# Patient Record
Sex: Male | Born: 2000 | Race: Black or African American | Hispanic: No | Marital: Single | Smoking: Never smoker
Health system: Southern US, Community
[De-identification: ages and names within clinical notes are randomized; demographics above are authoritative.]

## PROBLEM LIST (undated history)

## (undated) DIAGNOSIS — T7840XA Allergy, unspecified, initial encounter: Secondary | ICD-10-CM

## (undated) HISTORY — PX: CYSTECTOMY: SUR359

---
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
## 2022-03-06 ENCOUNTER — Encounter (HOSPITAL_BASED_OUTPATIENT_CLINIC_OR_DEPARTMENT_OTHER): Payer: Self-pay | Admitting: Emergency Medicine

## 2022-03-06 ENCOUNTER — Emergency Department (HOSPITAL_BASED_OUTPATIENT_CLINIC_OR_DEPARTMENT_OTHER): Payer: Medicaid Other

## 2022-03-06 ENCOUNTER — Emergency Department (HOSPITAL_BASED_OUTPATIENT_CLINIC_OR_DEPARTMENT_OTHER)
Admission: EM | Admit: 2022-03-06 | Discharge: 2022-03-06 | Disposition: A | Discharge status: Home / Self Care | Payer: Medicaid Other | Attending: Emergency Medicine | Admitting: Emergency Medicine

## 2022-03-06 ENCOUNTER — Other Ambulatory Visit: Payer: Self-pay

## 2022-03-06 DIAGNOSIS — Y990 Civilian activity done for income or pay: Secondary | ICD-10-CM | POA: Insufficient documentation

## 2022-03-06 DIAGNOSIS — X501XXA Overexertion from prolonged static or awkward postures, initial encounter: Secondary | ICD-10-CM | POA: Diagnosis not present

## 2022-03-06 DIAGNOSIS — M25572 Pain in left ankle and joints of left foot: Secondary | ICD-10-CM | POA: Diagnosis not present

## 2022-03-06 MED ORDER — IBUPROFEN 200 MG PO TABS: 600.0000 mg | ORAL_TABLET | Freq: Three times a day (TID) | ORAL | 0 refills | Status: DC | PRN

## 2022-03-06 NOTE — ED Notes (Signed)
Pt stable at time of discharge. RR even and unlabored. No acute distress. Discharge instructions reviewed with pt and he voiced understanding. Pt has no questions at this time. Pt ambulatory to lobby.

## 2022-03-06 NOTE — ED Triage Notes (Signed)
Felt a pop in his left ankle and is having pain up leg to left knee pt still able to ambulate  but hurts to walk

## 2022-03-06 NOTE — ED Provider Notes (Signed)
[facility] EMERGENCY DEPARTMENT Provider Note   CSN: [id number] Arrival date & time: 03/06/22  8841     History  Chief Complaint  Patient presents with   Ankle Pain    Trevor Welch is a 21 y.o. male who presents with 2 day h/o L foot/ankle pain. Pt states he was lifting something heavy when he felt a pop in the medial aspect of his right foot/ankle. Since then, pain is constant, worse when walking. He does heavy lifting for his job, which makes his pain worse. He denies numbness or tingling. He has ton taken anything including tylenol, ibuprofen, or used iced.   HPI     Home Medications Prior to Admission medications   Medication Sig Start Date End Date Taking? Authorizing Provider  ibuprofen (ADVIL) 200 MG tablet Take 3 tablets (600 mg total) by mouth every 8 (eight) hours as needed. For pain 03/06/22   Evynn Boutelle, PA-C      Allergies    Amoxicillin    Review of Systems   Review of Systems  Musculoskeletal:  Positive for arthralgias and joint swelling.  Skin:  Negative for color change and wound.  Neurological:  Negative for numbness.    Physical Exam Updated Vital Signs BP 107/68 (BP Location: Right Arm)   Pulse 66   Temp 98.2 F (36.8 C) (Oral)   Resp 16   Ht 5\' 9"  (1.753 m)   Wt 81.6 kg   SpO2 99%   BMI 26.58 kg/m  Physical Exam Vitals and nursing note reviewed.  Constitutional:      Appearance: Normal appearance.  HENT:     Head: Normocephalic and atraumatic.     Mouth/Throat:     Mouth: Mucous membranes are moist.  Eyes:     Pupils: Pupils are equal, round, and reactive to light.  Cardiovascular:     Rate and Rhythm: Normal rate.  Pulmonary:     Effort: Pulmonary effort is normal.  Abdominal:     General: There is no distension.  Musculoskeletal:        General: Swelling and tenderness present.     Cervical back: Normal range of motion.     Comments: Mild swelling of the medial malleolus, talus and navicular bones. Ttp of  this area.  No ttp elsewhere in the foot. No ttp over the lateral malleolus. Full active ROM of the ankle with pain, most prominent with dorsiflexion.   Skin:    General: Skin is warm.     Capillary Refill: Capillary refill takes less than 2 seconds.  Neurological:     Mental Status: He is alert and oriented to person, place, and time.  Psychiatric:        Behavior: Behavior normal.     ED Results / Procedures / Treatments   Labs (all labs ordered are listed, but only abnormal results are displayed) Labs Reviewed - No data to display  EKG None  Radiology DG Ankle Complete Left  Result Date: 03/06/2022 CLINICAL DATA:  Left foot and ankle pain.  Difficulty walking. EXAM: LEFT ANKLE COMPLETE - 3+ VIEW COMPARISON:  Left foot radiographs-03/06/2022 FINDINGS: No fracture or dislocation. Joint spaces are preserved. No joint effusion. Regional soft tissues appear normal. No radiopaque foreign body. IMPRESSION: No explanation for patient's left foot and ankle pain. Electronically Signed   By: 14/01/2022 M.D.   On: 03/06/2022 09:59   DG Foot Complete Left  Result Date: 03/06/2022 CLINICAL DATA:  Patient felt a pop in  the left ankle, now with difficulty walking. EXAM: LEFT FOOT - COMPLETE 3+ VIEW COMPARISON:  Left ankle radiographs-earlier same day FINDINGS: No fracture or dislocation. Joint spaces are preserved. No erosions. No significant hallux valgus deformity. No plantar calcaneal spur. Regional soft tissues appear normal. No radiopaque foreign body. IMPRESSION: No explanation for patient's left foot pain. Electronically Signed   By: Simonne Come M.D.   On: 03/06/2022 09:58    Procedures Procedures    Medications Ordered in ED Medications - No data to display  ED Course/ Medical Decision Making/ A&P                           Medical Decision Making Amount and/or Complexity of Data Reviewed Radiology: ordered.  Risk OTC drugs.    This patient presents to the ED for concern  of L foot/ankle pain. This involves a number of treatment options, and is a complaint that carries with it a low risk of complications and morbidity.  The differential diagnosis includes fracture, MSK injury.   Imaging Studies:  I ordered imaging studies including foot and ankle xrays.  I independently visualized and interpreted imaging which showed no acute fracture or dislocation. I agree with the radiologist interpretation  Disposition:  After consideration of the diagnostic results and the patients response to treatment, I feel that the patent would benefit from outpatient management with ankle brace, rest, ice, and nsaids. Discussed f/u with PCP if pain is not improving. At this time, pt appears safe for d/c. Return precautions given. Pt states he understands and agrees to plan.          Final Clinical Impression(s) / ED Diagnoses Final diagnoses:  Acute left ankle pain    Rx / DC Orders ED Discharge Orders          Ordered    ibuprofen (ADVIL) 200 MG tablet  Every 8 hours PRN        03/06/22 1011              Kayveon Lennartz, PA-C 03/06/22 1016    Long, Arlyss Repress, MD 03/10/22 1601

## 2022-03-06 NOTE — ED Notes (Signed)
Lace-up ankle braced applied by ED tech. Pt tolerated well.

## 2022-03-06 NOTE — Discharge Instructions (Addendum)
Take ibuprofen 3 times a day with meals.  Do not take other anti-inflammatories at the same time (Advil, Motrin, naproxen, Aleve). You may supplement with Tylenol if you need further pain control. Use ice packs for 20 minutes at a time 3 times a day Use the brace to help with pain control Follow up with your primary care doctor in 1 week if pain and swelling isn't improving Return to the ER if you develop numbness, worsening pain, or any new, worsening, or concerning symptoms.

## 2022-12-26 ENCOUNTER — Encounter (HOSPITAL_COMMUNITY): Payer: Self-pay | Admitting: Psychiatry

## 2022-12-26 ENCOUNTER — Ambulatory Visit (HOSPITAL_COMMUNITY)
Admission: EM | Admit: 2022-12-26 | Discharge: 2022-12-28 | Disposition: A | Discharge status: Other Acute Inpatient Hospital | Payer: 59 | Attending: Psychiatry | Admitting: Psychiatry

## 2022-12-26 DIAGNOSIS — Z9152 Personal history of nonsuicidal self-harm: Secondary | ICD-10-CM | POA: Diagnosis not present

## 2022-12-26 DIAGNOSIS — F5082 Avoidant/restrictive food intake disorder: Secondary | ICD-10-CM | POA: Insufficient documentation

## 2022-12-26 DIAGNOSIS — Z560 Unemployment, unspecified: Secondary | ICD-10-CM | POA: Diagnosis not present

## 2022-12-26 DIAGNOSIS — R440 Auditory hallucinations: Secondary | ICD-10-CM | POA: Diagnosis present

## 2022-12-26 DIAGNOSIS — F250 Schizoaffective disorder, bipolar type: Secondary | ICD-10-CM | POA: Insufficient documentation

## 2022-12-26 DIAGNOSIS — G4700 Insomnia, unspecified: Secondary | ICD-10-CM | POA: Diagnosis not present

## 2022-12-26 DIAGNOSIS — Z1152 Encounter for screening for COVID-19: Secondary | ICD-10-CM | POA: Insufficient documentation

## 2022-12-26 DIAGNOSIS — F29 Unspecified psychosis not due to a substance or known physiological condition: Secondary | ICD-10-CM | POA: Diagnosis not present

## 2022-12-26 DIAGNOSIS — F17290 Nicotine dependence, other tobacco product, uncomplicated: Secondary | ICD-10-CM | POA: Insufficient documentation

## 2022-12-26 DIAGNOSIS — R456 Violent behavior: Secondary | ICD-10-CM | POA: Diagnosis present

## 2022-12-26 LAB — CBC WITH DIFFERENTIAL/PLATELET
Abs Immature Granulocytes: 0.02 10*3/uL (ref 0.00–0.07)
Basophils Absolute: 0.1 10*3/uL (ref 0.0–0.1)
Basophils Relative: 1 %
Eosinophils Absolute: 1.3 10*3/uL — ABNORMAL HIGH (ref 0.0–0.5)
Eosinophils Relative: 17 %
HCT: 42.4 % (ref 39.0–52.0)
Hemoglobin: 14 g/dL (ref 13.0–17.0)
Immature Granulocytes: 0 %
Lymphocytes Relative: 29 %
Lymphs Abs: 2.2 10*3/uL (ref 0.7–4.0)
MCH: 27.3 pg (ref 26.0–34.0)
MCHC: 33 g/dL (ref 30.0–36.0)
MCV: 82.8 fL (ref 80.0–100.0)
Monocytes Absolute: 0.3 10*3/uL (ref 0.1–1.0)
Monocytes Relative: 3 %
Neutro Abs: 3.9 10*3/uL (ref 1.7–7.7)
Neutrophils Relative %: 50 %
Platelets: 276 10*3/uL (ref 150–400)
RBC: 5.12 MIL/uL (ref 4.22–5.81)
RDW: 13.7 % (ref 11.5–15.5)
WBC: 7.8 10*3/uL (ref 4.0–10.5)
nRBC: 0 % (ref 0.0–0.2)

## 2022-12-26 LAB — URINALYSIS, ROUTINE W REFLEX MICROSCOPIC
Bacteria, UA: NONE SEEN
Bilirubin Urine: NEGATIVE
Glucose, UA: NEGATIVE mg/dL
Hgb urine dipstick: NEGATIVE
Ketones, ur: 80 mg/dL — AB
Leukocytes,Ua: NEGATIVE
Nitrite: NEGATIVE
Protein, ur: 100 mg/dL — AB
Specific Gravity, Urine: 1.026 (ref 1.005–1.030)
pH: 5 (ref 5.0–8.0)

## 2022-12-26 LAB — HEPATIC FUNCTION PANEL
ALT: 15 U/L (ref 0–44)
AST: 21 U/L (ref 15–41)
Albumin: 4.5 g/dL (ref 3.5–5.0)
Alkaline Phosphatase: 48 U/L (ref 38–126)
Bilirubin, Direct: 0.2 mg/dL (ref 0.0–0.2)
Indirect Bilirubin: 1.1 mg/dL — ABNORMAL HIGH (ref 0.3–0.9)
Total Bilirubin: 1.3 mg/dL — ABNORMAL HIGH (ref 0.3–1.2)
Total Protein: 8 g/dL (ref 6.5–8.1)

## 2022-12-26 LAB — LIPID PANEL
Cholesterol: 200 mg/dL (ref 0–200)
HDL: 33 mg/dL — ABNORMAL LOW
LDL Cholesterol: 137 mg/dL — ABNORMAL HIGH (ref 0–99)
Total CHOL/HDL Ratio: 6.1 ratio
Triglycerides: 148 mg/dL
VLDL: 30 mg/dL (ref 0–40)

## 2022-12-26 LAB — POCT URINE DRUG SCREEN - MANUAL ENTRY (I-SCREEN)
POC Amphetamine UR: NOT DETECTED
POC Buprenorphine (BUP): NOT DETECTED
POC Cocaine UR: NOT DETECTED
POC Marijuana UR: POSITIVE — AB
POC Methadone UR: NOT DETECTED
POC Methamphetamine UR: NOT DETECTED
POC Morphine: NOT DETECTED
POC Oxazepam (BZO): NOT DETECTED
POC Oxycodone UR: NOT DETECTED
POC Secobarbital (BAR): NOT DETECTED

## 2022-12-26 LAB — VALPROIC ACID LEVEL: Valproic Acid Lvl: 50 ug/mL (ref 50.0–100.0)

## 2022-12-26 LAB — SARS CORONAVIRUS 2 BY RT PCR: SARS Coronavirus 2 by RT PCR: NEGATIVE

## 2022-12-26 LAB — HIV ANTIBODY (ROUTINE TESTING W REFLEX): HIV Screen 4th Generation wRfx: NONREACTIVE

## 2022-12-26 LAB — HEMOGLOBIN A1C
Hgb A1c MFr Bld: 5.5 % (ref 4.8–5.6)
Mean Plasma Glucose: 111.15 mg/dL

## 2022-12-26 LAB — TSH: TSH: 2.944 u[IU]/mL (ref 0.350–4.500)

## 2022-12-26 MED ORDER — ACETAMINOPHEN 325 MG PO TABS: 650.0000 mg | ORAL_TABLET | Freq: Four times a day (QID) | ORAL | Status: DC | PRN

## 2022-12-26 MED ORDER — ALUM & MAG HYDROXIDE-SIMETH 200-200-20 MG/5ML PO SUSP: 30.0000 mL | ORAL | Status: DC | PRN

## 2022-12-26 MED ORDER — TRAZODONE HCL 50 MG PO TABS
50.0000 mg | ORAL_TABLET | Freq: Every evening | ORAL | Status: DC | PRN
  Filled 2022-12-26: qty 1

## 2022-12-26 MED ORDER — PALIPERIDONE ER 3 MG PO TB24
3.0000 mg | ORAL_TABLET | Freq: Every day | ORAL | Status: DC
  Administered 2022-12-27 – 2022-12-28 (×2): 3 mg via ORAL
  Filled 2022-12-26 (×2): qty 1

## 2022-12-26 MED ORDER — MAGNESIUM HYDROXIDE 400 MG/5ML PO SUSP: 30.0000 mL | Freq: Every day | ORAL | Status: DC | PRN

## 2022-12-26 MED ORDER — DIVALPROEX SODIUM ER 500 MG PO TB24: 500.0000 mg | ORAL_TABLET | Freq: Once | ORAL | Status: DC

## 2022-12-26 NOTE — ED Provider Notes (Signed)
[facility] Continuous Assessment Admission H&P  Date: 12/26/22 Patient Name: Trevor Welch MRN: [id number] Chief Complaint: "I just been.Marland KitchenMarland KitchenI need help. I don't hear nothing. I hear distant noises"  Diagnoses:  Psychosis  HPI: Patient is noted pacing in the halls upon this writer's initial introduction. He was agreeable to return to his room to complete the psychiatric evaluation.  [name] is a 22 yo male presenting to [facility], accompanied by his mother, due to psychosis, aggression, food restriction, and self harmful behavior. Patient reports experiencing auditory hallucinations, stating "I hear distant noises". Patient reports not eating any food for the past week. Patient's mother states that patient has not eaten in the past 2 weeks and will only drink tap water from the faucet. Patient's mother states that she did not see Trevor Welch for quite some time but notices a significant weight loss when she did see him again. Patient reports feeling unsafe and not sleeping because "I don't feel welcomed anymore" at his mother's residence. Patient's mother reports that her son sits in a dark room at home or paces all night. Per his mother, residents (patient's mother and sister) of the home must lock their room doors at night and knives are hidden in the home to ensure safety. Per his mother, she is afraid of patient, as he has "pent up aggression" and attempted to jump from a moving vehicle yesterday. Trevor Welch was also evicted from his ex-girlfriend's home last week due to aggression. [name] states "I hugged her tight". He then moved in with a family member but "had to leave", per his mother. [name] states clearly, "I chose to leave". Per patient's mother, his symptoms have exacerbated since his ex-girlfriend had a miscarriage on November 26, 2022. Oddly, patient reports he had no knowledge of his ex-girlfriend being pregnant or about the miscarriage. Patient's mother reports being informed that  patient and his ex-girlfriend were living in unsanitary living conditions without electricity, prior to him being evicted.  Patient is noted to be fairly groomed. Unkempt fingernails and poor dental hygiene noted. Patient is wearing a jogging suit, with one arm outside of sweatshirt and hoodie pulled over his head. He is noted with an intense stare into space and appears to be responding to internal stimuli. Patient noted with slight smile at times while staring into space. He has limited engagement, poor communication skills, and blocked speech. His speech is garbled at times when answering questions, making it difficult to interpret answers. Patient speaks clearly when spontaneously speaking, interrupting his mother and a few other times during the visit. Patient is a poor historian and much of his history was obtained by his mother. Per his mother, patient was diagnosed with schizophrenia in 2022. She states that symptoms first began in 2021 and recalls an incident when Trevor Welch came home in 2021, unplugging electronics from sockets and stating they were all going to die. Since, patient had multiple ED presentations at [facility] and psychiatric hospitalizations, one being at [facility]. Patient is currently prescribed Invega 3 mg daily and Depakote 500 mg daily, but medication noncompliance is suspected, per his mother. Patient's mother reports a previous plan for medical providers to transition Kolt to a monthly [place] injection.   Patient's mother reports that patient visited the emergency department on Friday and UDS resulted positive for marijuana, although patient denies the use of illicit substances or alcohol. Patient does report vaping nicotine.  He denies SI/HI.  Patient is currently living  at home with his mother and sister (and able to return if symptoms are stabilized). He is unemployed, per his mother. He is established with [facility] and his  mother reports that the PA last visited the home yesterday.  Patient's mother reports that she has been diagnosed with bipolar disorder and anxiety, stating she was hospitalized due to SI in the past. Patient's mother reports that Trevor Welch's maternal grandfather was diagnosed with bipolar disorder and had "schizophrenia traits".  Total Time spent with patient: 30 minutes  Musculoskeletal  Strength & Muscle Tone: within normal limits Gait & Station: normal Patient leans: N/A  Psychiatric Specialty Exam  Presentation General Appearance:  Disheveled  Eye Contact: Minimal  Speech: Garbled; Blocked  Speech Volume: Decreased  Handedness:No data recorded  Mood and Affect  Mood: Dysphoric  Affect: Constricted   Thought Process  Thought Processes: Goal Directed  Descriptions of Associations:Intact  Orientation:Full (Time, Place and Person)  Thought Content:Scattered    Hallucinations:Hallucinations: Auditory Description of Auditory Hallucinations: "distant noises"  Ideas of Reference:Other (comment) (unable to determine. Patient noted with limited communication and thought blocking)  Suicidal Thoughts:Suicidal Thoughts: No  Homicidal Thoughts:Homicidal Thoughts: No   Sensorium  Memory: Other (comment) (unable to determine. Patient with limited communication.)  Judgment: Poor  Insight: Poor   Executive Functions  Concentration: Poor  Attention Span: Poor  Recall: Poor  Fund of Knowledge: Fair  Language: Fair   Lexicographer Activity: Psychomotor Activity: Normal   Assets  Assets: Social Support   Sleep  Sleep: Sleep: Poor   Nutritional Assessment (For OBS and FBC admissions only) Has the patient had a weight loss or gain of 10 pounds or more in the last 3 months?: Yes Has the patient had a decrease in food intake/or appetite?: Yes Does the patient have dental problems?: Yes Does the patient have eating  habits or behaviors that may be indicators of an eating disorder including binging or inducing vomiting?: No Has the patient recently lost weight without trying?: 2.0 Has the patient been eating poorly because of a decreased appetite?: 1 Malnutrition Screening Tool Score: 3    Physical Exam Vitals and nursing note reviewed.  Neurological:     Mental Status: He is alert and oriented to person, place, and time.    Review of Systems  Psychiatric/Behavioral:  Positive for hallucinations. Negative for suicidal ideas. The patient has insomnia.   All other systems reviewed and are negative.   Blood pressure 105/77, pulse 87, temperature 98.2 F (36.8 C), temperature source Oral, resp. rate 18, SpO2 100%. There is no height or weight on file to calculate BMI.  Past Psychiatric History:  "Schizophrenia" per his mother  Is the patient at risk to self? Yes  Patient attempted to jump out of a moving vehicle yesterday Has the patient been a risk to self in the past 6 months? Yes .   Patient with multiple psychiatric hospitalizations, per his mother Has the patient been a risk to self within the distant past? Yes   Is the patient a risk to others? Yes   Has the patient been a risk to others in the past 6 months? No   Has the patient been a risk to others within the distant past? No   Past Medical History: Migraine  Family History: see above  Social History: see above  Last Labs:  Admission on 12/26/2022  Component Date Value Ref Range Status   POC Amphetamine UR 12/26/2022 None Detected  NONE DETECTED (  Cut Off Level 1000 ng/mL) Final   POC Secobarbital (BAR) 12/26/2022 None Detected  NONE DETECTED (Cut Off Level 300 ng/mL) Final   POC Buprenorphine (BUP) 12/26/2022 None Detected  NONE DETECTED (Cut Off Level 10 ng/mL) Final   POC Oxazepam (BZO) 12/26/2022 None Detected  NONE DETECTED (Cut Off Level 300 ng/mL) Final   POC Cocaine UR 12/26/2022 None Detected  NONE DETECTED (Cut Off Level  300 ng/mL) Final   POC Methamphetamine UR 12/26/2022 None Detected  NONE DETECTED (Cut Off Level 1000 ng/mL) Final   POC Morphine 12/26/2022 None Detected  NONE DETECTED (Cut Off Level 300 ng/mL) Final   POC Methadone UR 12/26/2022 None Detected  NONE DETECTED (Cut Off Level 300 ng/mL) Final   POC Oxycodone UR 12/26/2022 None Detected  NONE DETECTED (Cut Off Level 100 ng/mL) Final   POC Marijuana UR 12/26/2022 Positive (A)  NONE DETECTED (Cut Off Level 50 ng/mL) Final    Allergies: Amoxicillin  Medications:  Facility Ordered Medications  Medication   acetaminophen (TYLENOL) tablet 650 mg   alum & mag hydroxide-simeth (MAALOX/MYLANTA) 200-200-20 MG/5ML suspension 30 mL   magnesium hydroxide (MILK OF MAGNESIA) suspension 30 mL   paliperidone (INVEGA) 24 hr tablet 3 mg   divalproex (DEPAKOTE ER) 24 hr tablet 500 mg   traZODone (DESYREL) tablet 50 mg   PTA Medications  Medication Sig   paliperidone (INVEGA) 3 MG 24 hr tablet Take 3 mg by mouth at bedtime. (Patient not taking: Reported on 12/26/2022)   pantoprazole (PROTONIX) 40 MG tablet Take 40 mg by mouth daily. (Patient not taking: Reported on 12/26/2022)   divalproex (DEPAKOTE) 500 MG DR tablet Take 500 mg by mouth daily. (Patient not taking: Reported on 12/26/2022)      Medical Decision Making  Patient presents to [facility], accompanied by his mother. Per his mother, patient has a psychiatric history of schizophrenia and possible medication noncompliance. Patient attempted to jump from a moving vehicle yesterday and has displayed aggression towards others. Patient is recommended for inpatient psychiatry. EKG and Labs (Depakote level, CBC w/diff, HGBa1C, Liver panel, UA, and UDS) ordered.    Recommendations  Based on my evaluation the patient does not appear to have an emergency medical condition.  Mcneil Sober, NP 12/26/22  2:46 PM

## 2022-12-26 NOTE — Progress Notes (Signed)
   12/26/22 1106  [facility] Triage Screening (Walk-ins at [facility] only)  How Did You Hear About [place]? Family/Friend  What Is the Reason for Your Visit/Call Today? Trevor Welch is a 22 year old male who present to [facility] voluntarily accompanied by his mother seeking an evaluation. Pt was seen today by his PCP and recommended for an evaluation. Patient was seen in ED on 9/27 for not being able to eat. Patient is unable to speak clearly to confirm symptoms. Pt has limited responses. He does report that she has not eaten in a week, but cannot elaborate. Per pts Mom's report she is unaware of how long patient has not been able to eat because the patient was living with his girlfriend until 1 week ago. Per pts mom's report the patient was prescribed medication but refuses to take it. Pts mother states the pt is supposed to be taking a Invega injection and Depakote. Pt is established with [facility] team. Pt has a hx of paranoid schizophrenia. Pt denies SI/HI and AVH.  How Long Has This Been Causing You Problems? 1 wk - 1 month  Have You Recently Had Any Thoughts About Hurting Yourself? No  Are You Planning to Commit Suicide/Harm Yourself At This time? No  Have you Recently Had Thoughts About Hurting Someone [name]? No  Are You Planning To Harm Someone At This Time? No  Are you currently experiencing any auditory, visual or other hallucinations? No  Have You Used Any Alcohol or Drugs in the Past 24 Hours? No  Do you have any current medical co-morbidities that require immediate attention? No  Clinician description of patient physical appearance/behavior: staring at a wall, limited speech, short responses, flat affect  What Do You Feel Would Help You the Most Today? Treatment for Depression or other mood problem  If access to [facility] was not available, would you have sought care in the Emergency Department? No  Determination of Need Urgent (48 hours)  Options For Referral Inpatient Hospitalization;Medication  Management

## 2022-12-26 NOTE — BH Assessment (Addendum)
Comprehensive Clinical Assessment (CCA) Note  12/26/2022 [facility] [id number]  Disposition: Per Trevor Sober, NP inpatient treatment is recommended.  [facility] to review.  Disposition SW to pursue appropriate inpatient options.  The patient demonstrates the following risk factors for suicide: Chronic risk factors for suicide include: psychiatric disorder of Schizoaffective Disorder, bipolar type and demographic factors (male, >23 y/o). Acute risk factors for suicide include: family or marital conflict, social withdrawal/isolation, and loss (financial, interpersonal, professional). Protective factors for this patient include: positive social support, responsibility to others (children, family), and hope for the future. Considering these factors, the overall suicide risk at this point appears to be low. Patient is appropriate for outpatient follow up, once stabilized.   Patient is a 22 year old male with a history of Schizoaffective Disorder, bipolar type who [facility]. Patient presents accompanied by his mother seeking an evaluation. He was seen today by his PCP and recommended for an evaluation, as the provider was concerned for symptoms, given patient has been off of his Rx meds for an unknown period.  Patient was seen in ED on 9/27 for not being able to eat. Patient is unable to speak clearly to confirm symptoms and answers are minimal and often inconsistent.  He speaks softly and is difficult to understand, especially as he is inconsistent with responses.  He will report symptoms, deny and then admit to symptoms again.  Upon discussion of AVH, patient initially responded "not really."  He then states he hears voices and then denies, stating they are "noises, nuances."  He denies VH.  Patient denies SI, HI and SA hx.  He does admit to vaping nicotine.  Patient briefly mentions he "may need help," however he is unable to elaborate.  He mentions he is  Rx Depakote 500mg  and "another new medication," however he unable to recall when he last took Rx medications.  He reports recent stressor has been his girlfriend asking him to leave "to have space."  He has been staying with his mother.  Patient denies other stressors.  He reports he isn't able to sleep, but unable to explain.  He also states he has had no appetite and hasn't eaten anything for a week.  Patient reports he has been involved with [facility] Welch in the past, and he would like to consider "a different care team" stating they weren't very helpful to him.  Patient has given verbal consent for provider and this clinician to speak with his mother, [facility], who is present.   Patient's mother expresses concern for patient's declining mental health condition.  She states she hasn't been as closely involved with patient's mental health treatment, since he has been living with his girlfriend.  Patient's girlfriend asked him to leave a week ago and patient's mother allowed him to move back in her home.  She does not feel this is a great option and hopes the [facility] team can provide assistance with housing options for patient.  He has been aggressive towards his mother at times and she is reporting she and her daughter are fearful of patient.  She is hoping patient will be admitted to get back on his medication.  She reports he has been asking to go to the hospital for help and then will change his mind.  She was trying to take him yesterday, when he changed his mind and tried to jump out of her car while moving.  Patient's mother states he can  return to her home once he is stable, however she hopes [facility] can assist with other options for patient.      Chief Complaint:  Chief Complaint  Patient presents with   Evaluation   Medication Management   Visit Diagnosis: Schizoaffective Disorder, bipolar type    CCA Screening, Triage and Referral (STR)  Patient Reported Information How did you  hear about [place]? Family/Friend  What Is the Reason for Your Visit/Call Today? [facility] is a 22 year old male who present to [facility] voluntarily accompanied by his mother seeking an evaluation. Pt was seen today by his PCP and recommended for an evaluation. Patient was seen in ED on 9/27 for not being able to eat. Patient is unable to speak clearly to confirm symptoms. Pt has limited responses. He does report that she has not eaten in a week, but cannot elaborate. Per pts Mom's report she is unaware of how long patient has not been able to eat because the patient was living with his girlfriend until 1 week ago. Per pts mom's report the patient was prescribed medication but refuses to take it. Pts mother states the pt is supposed to be taking a Invega injection and Depakote. Pt is established with [facility] team. Pt has a hx of paranoid schizophrenia. Pt denies SI/HI and AVH.  How Long Has This Been Causing You Problems? 1 wk - 1 month  What Do You Feel Would Help You the Most Today? Treatment for Depression or other mood problem   Have You Recently Had Any Thoughts About Hurting Yourself? No  Are You Planning to Commit Suicide/Harm Yourself At This time? No   Flowsheet Row ED from 12/26/2022 in [facility] from 03/06/2022 in [facility]  C-SSRS RISK CATEGORY No Risk No Risk       Have you Recently Had Thoughts About Hurting Someone [facility]? No  Are You Planning to Harm Someone at This Time? No  Explanation: N/A   Have You Used Any Alcohol or Drugs in the Past 24 Hours? No  What Did You Use and How Much? N/A   Do You Currently Have a Therapist/Psychiatrist? Yes  Name of Therapist/Psychiatrist: Name of Therapist/Psychiatrist: Patient has been followed by [facility], however they have not had recent contact with patient due to patient's lack of communication with them. Mother took him to see the provider with  [facility] yesterday and Welch team plans to continue services.   Have You Been Recently Discharged From Any Office Practice or Programs? No  Explanation of Discharge From Practice/Program: N/A     CCA Screening Triage Referral Assessment Type of Contact: Face-to-Face  Telemedicine Service Delivery:   Is this Initial or Reassessment?   Date Telepsych consult ordered in [facility]:    Time Telepsych consult ordered in [facility]:    [facility] of Assessment: [facility]  Provider Location: [facility] Assessment Services   Collateral Involvement: Mother provided collateral   Does Patient Have a Automotive engineer Guardian? No  Legal Guardian Contact Information: N/A  Copy of Legal Guardianship Form: -- (N/A)  Legal Guardian Notified of Arrival: -- (N/A)  Legal Guardian Notified of Pending Discharge: -- (N/A)  If Minor and Not Living with Parent(s), Who has Custody? N/A  Is CPS involved or ever been involved? Never  Is APS involved or ever been involved? Never   Patient Determined To Be At Risk for Harm To Self or  Others Based on Review of Patient Reported Information or Presenting Complaint? No  Method: -- (N/A, no HI)  Availability of Means: -- (N/A, no HI)  Intent: -- (N/A, no HI)  Notification Required: -- (N/A, no HI)  Additional Information for Danger to Others Potential: -- (N/A, no HI)  Additional Comments for Danger to Others Potential: N/A, no HI  Are There Guns or Other Weapons in Your Home? No  Types of Guns/Weapons: N/A  Are These Weapons Safely Secured?                            -- (N/A)  Who Could Verify You Are Able To Have These Secured: N/A  Do You Have any Outstanding Charges, Pending Court Dates, Parole/Probation? None  Contacted To Inform of Risk of Harm To Self or Others: -- (N/A, no HI)    Does Patient Present under Involuntary Commitment? No    [place] of Residence: [place]   Patient Currently Receiving the Following  Services: Medication Management   Determination of Need: Urgent (48 hours)   Options For Referral: Inpatient Hospitalization; Medication Management     CCA Biopsychosocial Patient Reported Schizophrenia/Schizoaffective Diagnosis in Past: Yes   Strengths: Patient tells [place] he is wanting to get back on his medications, has family support   Mental Health Symptoms Depression:   Hopelessness; Worthlessness; Weight gain/loss; Increase/decrease in appetite   Duration of Depressive symptoms:  Duration of Depressive Symptoms: Greater than two weeks   Mania:   None   Anxiety:    Worrying; Tension; Sleep   Psychosis:   Hallucinations   Duration of Psychotic symptoms:  Duration of Psychotic Symptoms: Greater than six months   Trauma:   None   Obsessions:   None   Compulsions:   None   Inattention:   N/A   Hyperactivity/Impulsivity:   N/A   Oppositional/Defiant Behaviors:   N/A   Emotional Irregularity:   Mood lability   Other Mood/Personality Symptoms:   NA    Mental Status Exam Appearance and self-care  Stature:   Average   Weight:   Thin   Clothing:   Casual   Grooming:   Normal   Cosmetic use:   None   Posture/gait:   Normal   Motor activity:   Slowed   Sensorium  Attention:   Distractible   Concentration:   Preoccupied   Orientation:   Person; Place; Object   Recall/memory:   Defective in Immediate; Defective in Short-term   Affect and Mood  Affect:   Constricted; Flat   Mood:   Dysphoric   Relating  Eye contact:   Fleeting   Facial expression:   Constricted   Attitude toward examiner:   Cooperative   Thought and Language  Speech flow:  Soft   Thought content:   Appropriate to Mood and Circumstances   Preoccupation:   None   Hallucinations:   Auditory (admits to [facility], denies and then admits again stating he hears "distorted stuff")   Organization:   Disorganized   Company secretary of  Knowledge:   Impoverished by (Comment) (current mental status)   Intelligence:   Average   Abstraction:   Functional   Judgement:   Impaired   Reality Testing:   Distorted   Insight:   Flashes of insight   Decision Making:   Confused; Vacilates   Social Functioning  Social Maturity:   Irresponsible   Social Judgement:  Naive   Stress  Stressors:   Relationship; Transitions; Family conflict   Coping Ability:   Exhausted   Skill Deficits:   Communication; Decision making; Responsibility   Supports:   Family     Religion: Religion/Spirituality Are You A Religious Person?: No How Might This Affect Treatment?: N/A  Leisure/Recreation: Leisure / Recreation Do You Have Hobbies?: No  Exercise/Diet: Exercise/Diet Do You Exercise?: No Have You Gained or Lost A Significant Amount of Weight in the Past Six Months?: No Do You Follow a Special Diet?: No Do You Have Any Trouble Sleeping?: Yes Explanation of Sleeping Difficulties: Per mother, patient is up most nights pacing in the dark.   CCA Employment/Education Employment/Work Situation: Employment / Work Situation Employment Situation: Unemployed Patient's Job has Been Impacted by Current Illness:  (N/A) Has Patient ever Been in Equities trader?: No  Education: Education Is Patient Currently Attending School?: No Last Grade Completed: 12 Did You Product manager?: No Did You Have An Individualized Education Program (IIEP): No Did You Have Any Difficulty At School?: No Patient's Education Has Been Impacted by Current Illness: No   CCA Family/Childhood History Family and Relationship History: Family history Marital status: Single Does patient have children?: No  Childhood History:  Childhood History By whom was/is the patient raised?: Mother Did patient suffer any verbal/emotional/physical/sexual abuse as a child?: No Did patient suffer from severe childhood neglect?: No Has patient ever been  sexually abused/assaulted/raped as an adolescent or adult?: No Was the patient ever a victim of a crime or a disaster?: No Witnessed domestic violence?: No Has patient been affected by domestic violence as an adult?: No       CCA Substance Use Alcohol/Drug Use: Alcohol / Drug Use Pain Medications: See MAR Prescriptions: See MAR Over the Counter: See MAR History of alcohol / drug use?: No history of alcohol / drug abuse Longest period of sobriety (when/how long): Patient reports he vapes nicotine.  Mother states a recent UDS was positive for [facility], although patient denies use.                         ASAM's:  Six Dimensions of Multidimensional Assessment  Dimension 1:  Acute Intoxication and/or Withdrawal Potential:      Dimension 2:  Biomedical Conditions and Complications:      Dimension 3:  Emotional, Behavioral, or Cognitive Conditions and Complications:     Dimension 4:  Readiness to Change:     Dimension 5:  Relapse, Continued use, or Continued Problem Potential:     Dimension 6:  Recovery/Living Environment:     ASAM Severity Score:    ASAM Recommended Level of Treatment:     Substance use Disorder (SUD)    Recommendations for Services/Supports/Treatments:    Discharge Disposition:    DSM5 Diagnoses: Patient Active Problem List   Diagnosis Date Noted   Obesity 06/07/2011   Headache(784.0) 06/07/2011   Migraine 06/07/2011     Referrals to Alternative Service(s): Referred to Alternative Service(s):   Place:   Date:   Time:    Referred to Alternative Service(s):   Place:   Date:   Time:    Referred to Alternative Service(s):   Place:   Date:   Time:    Referred to Alternative Service(s):   Place:   Date:   Time:     Yetta Glassman, [facility] [place]

## 2022-12-26 NOTE — ED Notes (Signed)
Patient observed/assessed in bed/chair resting quietly appearing in no distress and verbalizing no complaints at this time. Will continue to monitor.  

## 2022-12-26 NOTE — Progress Notes (Signed)
LCSW Progress Note  [id number]   Trevor [facility]  12/26/2022  4:04 PM  Description:   Inpatient Psychiatric Referral  Patient was recommended inpatient per Mcneil Sober, NP. There are no available beds at [facility], per [facility] Rona Ravens, RN. Patient was referred to the following out of network facilities:   Destination  Service Provider Address Phone Fax  [facility]  [address], [place] [address] [phone]  [facility] [address]  [address] [place] [address] [phone]  [facility]  [address] [place], [place] [address] [phone]  [facility]  [address] [place], [place] [address] [phone]  [facility]  [address] [place] [address] [phone]  [facility]  [address] [place], [place] [address] [phone]  [facility]  [address] [place] [address] [phone]  [facility]  [address] [place] [address] [phone]  [facility]  [address], [place] [address] [phone]  [facility]  [address] [place] [address] [phone]  [facility]  [address], [place] [address] [phone]  [facility]  [address] [place] [address] [phone]  [facility]  [place] [phone]  [facility] EFAX  [address] [place], [place] [phone]  [facility]  [address] [place] [address] [phone]  [facility]  [address], [place] [address] [phone]   [facility]  [address] [place], [place] [address] [phone]  [facility]  [address], [place] [address] [phone]  [facility] Psychiat[facility] Inpatient EFAX  [place] [phone]    Situation ongoing, CSW to continue following and update chart as more information becomes available.      Cathie Beams, LCSW  12/26/2022 4:04 PM

## 2022-12-26 NOTE — ED Notes (Signed)
Patient in milieu. Environment is secured. Will continue to monitor for safety. 

## 2022-12-26 NOTE — ED Notes (Signed)
Provider states ok that patient has already taken his medication for the day.

## 2022-12-26 NOTE — ED Notes (Signed)
Rn notified provider that patient states that he took his invega and depakote earlier today and refused it

## 2022-12-26 NOTE — ED Notes (Signed)
Pt admitted to obs . Denies SI/HI/AVH. Calm, cooperative throughout interview process. Skin assessment completed. Oriented to unit. Meal and drink offered. At currrent, pt continue to denies SI/HI/AVH. Pt verbally contract for safety. Will monitor for safety. 

## 2022-12-26 NOTE — ED Notes (Signed)
Patient observed/assessed at bedside watching tv. Patient alert and oriented x 3. Affect is blunted.  Patient denies pain and anxiety. He denies A/V/H. He denies having any thoughts/plan of self harm and harm towards others. Fluid and snack offered. Patient states that appetite has been good throughout the day. Verbalizes no further complaints at this time. Will continue to monitor and support.

## 2022-12-27 DIAGNOSIS — F29 Unspecified psychosis not due to a substance or known physiological condition: Secondary | ICD-10-CM | POA: Diagnosis not present

## 2022-12-27 NOTE — Progress Notes (Signed)
LCSW Progress Note  [id number]   ZAKIAH BECKERMAN  12/27/2022  12:45 AM    Inpatient Behavioral Health Placement  Pt meets inpatient criteria per Mcneil Sober, NP.  There are no available beds [facility] per [facility] Rona Ravens, RN. Referral was sent to the following facilities;   Destination  Service Provider Address Phone Fax  [facility]  [address], [place] [address] [phone]  [facility] [address]  [address], [place] [address] [phone]  [facility] [address] [place]  [address] [place], [place] [address] [phone]  [facility]  [address] [place], [place] [address] [phone]  [facility]  [address], [place] [address] [phone]  [facility]  [address] [place], [place] [address] [phone]  [facility]  [address] [place] [address] [phone]  [facility]  [address] [place] [address] [phone]  [facility]  [address] [place] [address] [phone]  [facility]  [address] [place] [address] [phone]  [facility]  [address] [place] [address] [phone]  [facility]  [address], [place] [address] [phone]  [facility] Management Behavioral Health  [place] [phone]  [facility] EFAX  [address] [place], [place] [phone]  [facility]  [address] [place] [address] [phone]  [facility]  [address] [place], [place] [address] [phone]   [facility]  [address] [place], [place] [address] [phone]  [facility]  [address], [place] [address] [phone]  [facility] Psychiat[facility] Inpatient EFAX  [place] [phone]    Situation ongoing,  CSW will follow up.    Maryjean Ka, MSW, LCSWA 12/27/2022 12:45 AM

## 2022-12-27 NOTE — ED Notes (Signed)
Canceled Safe Transport due to patient IVC'd

## 2022-12-27 NOTE — ED Notes (Signed)
Patient refused lunch

## 2022-12-27 NOTE — Progress Notes (Signed)
Pt was accepted to [facility] TODAY 12/27/2022  Pt meets inpatient criteria per Mcneil Sober, NP   Attending Physician will be Dr. Phyllis Ginger. [place], MD  Report can be called to: - [phone]  Pt can arrive after:10:00am  Care Team notified: Zoe Lan, [address] LCSWA 12/27/2022 @ 1:21 AM

## 2022-12-27 NOTE — ED Notes (Signed)
Patient observed/assessed in bed/chair resting quietly appearing in no distress and verbalizing no complaints at this time. Will continue to monitor.  

## 2022-12-27 NOTE — ED Notes (Signed)
Pt sleeping@this time breathing even and unlabored will continue to monitor for safety 

## 2022-12-27 NOTE — ED Notes (Signed)
Patient A&Ox4. When asked how he is doing, patient states "I'm ok." Denies intent to harm self/others. Denies SI or A/VH. Patient denies any physical complaints. Patient does appear to be paranoid. When asking questions he looks around. No acute distress observed. Routine safety checks conducted according to facility protocol. Patient agreed to notify staff if thoughts of harm toward self or others arise. Will continue to monitor for safety.

## 2022-12-27 NOTE — ED Notes (Signed)
Pt resting in bed he is calm and cooperative he has smirk on his face when you are interacting with him he answered all question that was asked of him he denies SI/HI/AVH will continue to monitor for safety

## 2022-12-27 NOTE — ED Notes (Signed)
Patient refused breakfast 

## 2022-12-27 NOTE — ED Notes (Signed)
Patient currently awake, laying in bed. No acute needs at this time. Will continue to monitor for safety.

## 2022-12-27 NOTE — ED Notes (Signed)
Called report to Gregery Na., RN at [facility]. Per [employer], unable to take patient until tonight or tomorrow morning. They will keep [place] updated. Called [place] and spoke with Pratt in admissions. She asks that a call be made when transportation has picked up the patient. [phone]

## 2022-12-27 NOTE — Group Note (Signed)
Group Topic: Communication  Group Date: 12/27/2022 Start Time: 2000 End Time: 2030 Facilitators: Rae Lips B  Department: [facility]  Number of Participants: 3  Group Focus: abuse issues, acceptance, and activities of daily living skills Treatment Modality:  Exposure Therapy Interventions utilized were leisure development Purpose: express feelings  Name: BARUC TUGWELL Date of Birth: 08-Dec-2000  MR: [id number]    Level of Participation: Pt did not attend group Quality of Participation: NA Interactions with others: NA Mood/Affect: NA Triggers (if applicable): NA Cognition: NA Progress: Other Response: NA Plan: patient will be encouraged to go to groups.   Patients Problems:  Patient Active Problem List   Diagnosis Date Noted   Psychosis (HCC) 12/26/2022   Obesity 06/07/2011   Headache 06/07/2011   Migraine 06/07/2011

## 2022-12-27 NOTE — ED Provider Notes (Cosign Needed Addendum)
Behavioral Health Progress Note  Date and Time: 12/27/2022 7:31 PM Name: Trevor Welch MRN:  [id number]  Subjective:  "It's there"  Diagnosis:  Final diagnoses:  Psychosis, unspecified psychosis type (HCC)    Total Time spent with patient: 15 minutes  Past Psychiatric History: Trevor Welch is a 22 yo male, who presented to [facility] on 12/26/22 due to psychosis, aggression, food restriction, and self harmful behavior. He has a history of schizophrenia, per his mother who accompanied patient upon his initial presentation at [facility]. Today, he is noted sitting on his bed. Patient shrugged shoulders when assessing his mood. Constricted affect noted. He reports not enjoying the meals and reports not eating. Thought/speech blocking noted, as patient displays facial grimacing and staring, as opposed to verbally communicating at times. He appears to respond to internal stimuli. When asked if auditory hallucinations were still present, he stated "it's there".   Per staff, patient is requesting to discharge. Patient presented for inpatient treatment voluntarily. IVC paperwork completed by alternate NP, as patient is considered an unsafe discharge. Prior to admission, patient attempted to jump out of a moving vehicle. Patient also displayed aggressive behavior towards his ex-girlfriend and mother, as well as medication noncompliance prior to admission.   Patient does not respond during assessment of SI, HI, paranoia, or delusional thought.  Past Medical History: Migraine Family History: none reported Family Psychiatric  History: mother: bipolar disorder, anxiety. Paternal grandfather: "schizophrenia traits" per patient's mother and bipolar diosrder Social History: Patient is unemployed, living with his mother due to his ex-girlfriend evicting  Additional Social History:    Pain Medications: See MAR Prescriptions: See MAR Over the Counter: See MAR History of alcohol / drug use?: No history of alcohol  / drug abuse Longest period of sobriety (when/how long): Patient reports he vapes nicotine.  Mother states a recent UDS was positive for [facility], although patient denies use.                    Sleep: Poor  Appetite:  Poor  Current Medications:  Current Facility-Administered Medications  Medication Dose Route Frequency Provider Last Rate Last Admin   acetaminophen (TYLENOL) tablet 650 mg  650 mg Oral Q6H PRN Jenai Scaletta, NP       alum & mag hydroxide-simeth (MAALOX/MYLANTA) 200-200-20 MG/5ML suspension 30 mL  30 mL Oral Q4H PRN Ayda Tancredi, NP       divalproex (DEPAKOTE ER) 24 hr tablet 500 mg  500 mg Oral Once Ameliyah Sarno, Cranston Neighbor, NP       magnesium hydroxide (MILK OF MAGNESIA) suspension 30 mL  30 mL Oral Daily PRN Angeli Demilio, Cranston Neighbor, NP       paliperidone (INVEGA) 24 hr tablet 3 mg  3 mg Oral Daily Kayia Billinger, NP   3 mg at 12/27/22 0913   traZODone (DESYREL) tablet 50 mg  50 mg Oral QHS PRN Trevante Tennell, Cranston Neighbor, NP       Current Outpatient Medications  Medication Sig Dispense Refill   divalproex (DEPAKOTE) 500 MG DR tablet Take 500 mg by mouth daily. (Patient not taking: Reported on 12/26/2022)     paliperidone (INVEGA) 3 MG 24 hr tablet Take 3 mg by mouth at bedtime. (Patient not taking: Reported on 12/26/2022)     pantoprazole (PROTONIX) 40 MG tablet Take 40 mg by mouth daily. (Patient not taking: Reported on 12/26/2022)      Labs  Lab Results:  Admission on 12/26/2022  Component Date Value Ref Range Status  SARS Coronavirus 2 by RT PCR 12/26/2022 NEGATIVE  NEGATIVE Final   Performed at [facility], [address], [place], [place] [address]   WBC 12/26/2022 7.8  4.0 - 10.5 K/uL Final   RBC 12/26/2022 5.12  4.22 - 5.81 MIL/uL Final   Hemoglobin 12/26/2022 14.0  13.0 - 17.0 g/dL Final   HCT 60/45/4098 42.4  39.0 - 52.0 % Final   MCV 12/26/2022 82.8  80.0 - 100.0 fL Final   MCH 12/26/2022 27.3  26.0 - 34.0 pg Final   MCHC 12/26/2022 33.0  30.0 - 36.0 g/dL Final   RDW 11/91/4782 13.7   11.5 - 15.5 % Final   Platelets 12/26/2022 276  150 - 400 K/uL Final   nRBC 12/26/2022 0.0  0.0 - 0.2 % Final   Neutrophils Relative % 12/26/2022 50  % Final   Neutro Abs 12/26/2022 3.9  1.7 - 7.7 K/uL Final   Lymphocytes Relative 12/26/2022 29  % Final   Lymphs Abs 12/26/2022 2.2  0.7 - 4.0 K/uL Final   Monocytes Relative 12/26/2022 3  % Final   Monocytes Absolute 12/26/2022 0.3  0.1 - 1.0 K/uL Final   Eosinophils Relative 12/26/2022 17  % Final   Eosinophils Absolute 12/26/2022 1.3 (H)  0.0 - 0.5 K/uL Final   Basophils Relative 12/26/2022 1  % Final   Basophils Absolute 12/26/2022 0.1  0.0 - 0.1 K/uL Final   Immature Granulocytes 12/26/2022 0  % Final   Abs Immature Granulocytes 12/26/2022 0.02  0.00 - 0.07 K/uL Final   Performed at [facility], [address], [place], [place] [address]   Hgb A1c MFr Bld 12/26/2022 5.5  4.8 - 5.6 % Final   Comment: (NOTE) Pre diabetes:          5.7%-6.4%  Diabetes:              >6.4%  Glycemic control for   <7.0% adults with diabetes    Mean Plasma Glucose 12/26/2022 111.15  mg/dL Final   Performed at [facility], [address] [place], [place] [address]   Cholesterol 12/26/2022 200  0 - 200 mg/dL Final   Triglycerides 78/46/9629 148  <150 mg/dL Final   HDL 52/84/1324 33 (L)  >40 mg/dL Final   Total CHOL/HDL Ratio 12/26/2022 6.1  RATIO Final   VLDL 12/26/2022 30  0 - 40 mg/dL Final   LDL Cholesterol 12/26/2022 137 (H)  0 - 99 mg/dL Final   Comment:        Total Cholesterol/HDL:CHD Risk Coronary Heart Disease Risk Table                     Men   Women  1/2 Average Risk   3.4   3.3  Average Risk       5.0   4.4  2 X Average Risk   9.6   7.1  3 X Average Risk  23.4   11.0        Use the calculated Patient Ratio above and the CHD Risk Table to determine the patient's CHD Risk.        ATP III CLASSIFICATION (LDL):  <100     mg/dL   Optimal  401-027  mg/dL   Near or Above                    Optimal  130-159  mg/dL    Borderline  253-664  mg/dL  High  >190     mg/dL   Very High Performed at [facility], [address], [place], [place] [address]    TSH 12/26/2022 2.944  0.350 - 4.500 uIU/mL Final   Comment: Performed by a 3rd Generation assay with a functional sensitivity of <=0.01 uIU/mL. Performed at [facility], [address] [place], [place] [address]    Total Protein 12/26/2022 8.0  6.5 - 8.1 g/dL Final   Albumin 09/81/1914 4.5  3.5 - 5.0 g/dL Final   AST 78/29/5621 21  15 - 41 U/L Final   ALT 12/26/2022 15  0 - 44 U/L Final   Alkaline Phosphatase 12/26/2022 48  38 - 126 U/L Final   Total Bilirubin 12/26/2022 1.3 (H)  0.3 - 1.2 mg/dL Final   Bilirubin, Direct 12/26/2022 0.2  0.0 - 0.2 mg/dL Final   Indirect Bilirubin 12/26/2022 1.1 (H)  0.3 - 0.9 mg/dL Final   Performed at [facility], [address], [place], [place] [address]   Color, Urine 12/26/2022 YELLOW  YELLOW Final   APPearance 12/26/2022 CLEAR  CLEAR Final   Specific Gravity, Urine 12/26/2022 1.026  1.005 - 1.030 Final   pH 12/26/2022 5.0  5.0 - 8.0 Final   Glucose, UA 12/26/2022 NEGATIVE  NEGATIVE mg/dL Final   Hgb urine dipstick 12/26/2022 NEGATIVE  NEGATIVE Final   Bilirubin Urine 12/26/2022 NEGATIVE  NEGATIVE Final   Ketones, ur 12/26/2022 80 (A)  NEGATIVE mg/dL Final   Protein, ur 78/46/9629 100 (A)  NEGATIVE mg/dL Final   Nitrite 52/84/1324 NEGATIVE  NEGATIVE Final   Leukocytes,Ua 12/26/2022 NEGATIVE  NEGATIVE Final   RBC / HPF 12/26/2022 0-5  0 - 5 RBC/hpf Final   WBC, UA 12/26/2022 0-5  0 - 5 WBC/hpf Final   Bacteria, UA 12/26/2022 NONE SEEN  NONE SEEN Final   Squamous Epithelial / HPF 12/26/2022 0-5  0 - 5 /HPF Final   Mucus 12/26/2022 PRESENT   Final   Performed at [facility], [address] [place], [place] [address]   POC Amphetamine UR 12/26/2022 None Detected  NONE DETECTED (Cut Off Level 1000 ng/mL) Final   POC Secobarbital (BAR) 12/26/2022 None Detected  NONE DETECTED (Cut Off  Level 300 ng/mL) Final   POC Buprenorphine (BUP) 12/26/2022 None Detected  NONE DETECTED (Cut Off Level 10 ng/mL) Final   POC Oxazepam (BZO) 12/26/2022 None Detected  NONE DETECTED (Cut Off Level 300 ng/mL) Final   POC Cocaine UR 12/26/2022 None Detected  NONE DETECTED (Cut Off Level 300 ng/mL) Final   POC Methamphetamine UR 12/26/2022 None Detected  NONE DETECTED (Cut Off Level 1000 ng/mL) Final   POC Morphine 12/26/2022 None Detected  NONE DETECTED (Cut Off Level 300 ng/mL) Final   POC Methadone UR 12/26/2022 None Detected  NONE DETECTED (Cut Off Level 300 ng/mL) Final   POC Oxycodone UR 12/26/2022 None Detected  NONE DETECTED (Cut Off Level 100 ng/mL) Final   POC Marijuana UR 12/26/2022 Positive (A)  NONE DETECTED (Cut Off Level 50 ng/mL) Final   Valproic Acid Lvl 12/26/2022 50  50.0 - 100.0 ug/mL Final   Performed at [facility], [address], [place], [place] [address]   HIV Screen 4th Generation wRfx 12/26/2022 Non Reactive  Non Reactive Final   Performed at [facility], [address] [place], [place] [address]    Blood Alcohol level:  No results found for: "ETH"  Metabolic Disorder Labs: Lab  Results  Component Value Date   HGBA1C 5.5 12/26/2022   MPG 111.15 12/26/2022   No results found for: "PROLACTIN" Lab Results  Component Value Date   CHOL 200 12/26/2022   TRIG 148 12/26/2022   HDL 33 (L) 12/26/2022   CHOLHDL 6.1 12/26/2022   VLDL 30 12/26/2022   LDLCALC 137 (H) 12/26/2022    Therapeutic Lab Levels: No results found for: "LITHIUM" Lab Results  Component Value Date   VALPROATE 50 12/26/2022   No results found for: "CBMZ"  Physical Findings   Flowsheet Row ED from 12/26/2022 in [facility] ED from 03/06/2022 in [facility] Emergency Department at [facility]  C-SSRS RISK CATEGORY No Risk No Risk        Musculoskeletal  Strength & Muscle Tone: within normal limits Gait & Station: normal Patient leans:  N/A  Psychiatric Specialty Exam  Presentation  General Appearance:  Fairly Groomed  Eye Contact: Good  Speech: Garbled  Speech Volume: Decreased  Handedness:No data recorded  Mood and Affect  Mood: Dysphoric  Affect: Constricted   Thought Process  Thought Processes: Goal Directed  Descriptions of Associations:Intact  Orientation:Full (Time, Place and Person)  Thought Content:Scattered  Diagnosis of Schizophrenia or Schizoaffective disorder in past: Yes  Duration of Psychotic Symptoms: Greater than six months   Hallucinations:Hallucinations: Auditory Description of Auditory Hallucinations: "distant noises"  Ideas of Reference:Other (comment) (unable to determine. Patient noted with limited communication and thought blocking)  Suicidal Thoughts:Suicidal Thoughts: No  Homicidal Thoughts:Homicidal Thoughts: No   Sensorium  Memory: Other (comment) (unable to determine. Patient with limited communication.)  Judgment: Poor  Insight: Poor   Executive Functions  Concentration: Poor  Attention Span: Poor  Recall: Poor  Fund of Knowledge: Fair  Language: Fair   Lexicographer Activity: Psychomotor Activity: Normal   Assets  Assets: Social Support   Sleep  Sleep: Sleep: Poor   Nutritional Assessment (For OBS and FBC admissions only) Has the patient had a weight loss or gain of 10 pounds or more in the last 3 months?: Yes Has the patient had a decrease in food intake/or appetite?: Yes Does the patient have dental problems?: Yes Does the patient have eating habits or behaviors that may be indicators of an eating disorder including binging or inducing vomiting?: No Has the patient recently lost weight without trying?: 2.0 Has the patient been eating poorly because of a decreased appetite?: 1 Malnutrition Screening Tool Score: 3    Physical Exam  Physical Exam Vitals and nursing note reviewed.  Neurological:      Mental Status: He is alert and oriented to person, place, and time.    Review of Systems  Psychiatric/Behavioral:  Positive for hallucinations. Negative for memory loss, substance abuse and suicidal ideas. The patient is not nervous/anxious and does not have insomnia.   All other systems reviewed and are negative.  Blood pressure 132/87, pulse 87, temperature 98.5 F (36.9 C), temperature source Oral, resp. rate 16, SpO2 100%. There is no height or weight on file to calculate BMI.  Treatment Plan Summary: Daily contact with patient to assess and evaluate symptoms and progress in treatment  Mcneil Sober, NP 12/27/2022 7:31 PM

## 2022-12-27 NOTE — ED Notes (Signed)
[employer] Dispatch requested to Serve IVC papers.

## 2022-12-27 NOTE — ED Notes (Signed)
Attempted to call [facility] to update that pt is IVCd and will be coming by [facility]. No answer. Will relay this information to oncoming nurse.

## 2022-12-28 DIAGNOSIS — F29 Unspecified psychosis not due to a substance or known physiological condition: Secondary | ICD-10-CM | POA: Diagnosis not present

## 2022-12-28 NOTE — ED Notes (Signed)
Pt sleeping@this time breathing even and unlabored will continue to monitor for safety 

## 2022-12-28 NOTE — ED Provider Notes (Addendum)
FBC/OBS ASAP Discharge Summary  Date and Time: 12/28/2022 9:08 AM  Name: Trevor Welch  MRN:  [id number]   Discharge Diagnoses:  Final diagnoses:  Psychosis, unspecified psychosis type (HCC)    Subjective: On evaluation, patient is observed lying down in bed in no acute distress. He is alert and oriented x 3. His thought process is linear and speech is delayed with apparent thought blocking. His mood is dysphoric and affect is congruent. He is cooperative without any noticeable aggressive behaviors or agitation during the exam. He denies SI/HI. He denies AVH. However, he states "they think I am seeing stuff but I am not." He reports fair sleep. He reports a good appetite. He denies physical complaints.   Stay Summary: Trevor Welch is a 22 y.o. male patient who presented to the [facility] due to psychosis, aggression, food restriction and self harm behaviors. Patient was started on Depakote 500 mg po daily but refused to take the medication. He has been compliant with taking the Invega 3 mg po daily. He was accepted to [facility] on 12/27/22.   Total Time spent with patient: 15 minutes  Past Psychiatric History: A reported history of schizophrenia . Patient is established with [facility].   Past Medical History: history of migraines.   Family History: No reported family medical history.   Family Psychiatric History: Patient's mother reports she has a history of bipolar and anxiety. Patient's mother reports (M) grandfather dx with bipolar disorder and had schizophrenia traits.   Social History: Patient resides with his mother and sister. Patient is unemployed. Patient uses marijuana.    Tobacco Cessation:  A prescription for an FDA-approved tobacco cessation medication was offered at discharge and the patient refused  Current Medications:  Current Facility-Administered Medications  Medication Dose Route Frequency Provider Last Rate Last Admin   acetaminophen (TYLENOL) tablet  650 mg  650 mg Oral Q6H PRN Penn, Cicely, NP       alum & mag hydroxide-simeth (MAALOX/MYLANTA) 200-200-20 MG/5ML suspension 30 mL  30 mL Oral Q4H PRN Penn, Cicely, NP       divalproex (DEPAKOTE ER) 24 hr tablet 500 mg  500 mg Oral Once Penn, Cranston Neighbor, NP       magnesium hydroxide (MILK OF MAGNESIA) suspension 30 mL  30 mL Oral Daily PRN Penn, Cranston Neighbor, NP       paliperidone (INVEGA) 24 hr tablet 3 mg  3 mg Oral Daily Penn, Cicely, NP   3 mg at 12/27/22 0913   traZODone (DESYREL) tablet 50 mg  50 mg Oral QHS PRN Penn, Cranston Neighbor, NP       Current Outpatient Medications  Medication Sig Dispense Refill   divalproex (DEPAKOTE) 500 MG DR tablet Take 500 mg by mouth daily. (Patient not taking: Reported on 12/26/2022)     paliperidone (INVEGA) 3 MG 24 hr tablet Take 3 mg by mouth at bedtime. (Patient not taking: Reported on 12/26/2022)     pantoprazole (PROTONIX) 40 MG tablet Take 40 mg by mouth daily. (Patient not taking: Reported on 12/26/2022)      PTA Medications:  Facility Ordered Medications  Medication   acetaminophen (TYLENOL) tablet 650 mg   alum & mag hydroxide-simeth (MAALOX/MYLANTA) 200-200-20 MG/5ML suspension 30 mL   magnesium hydroxide (MILK OF MAGNESIA) suspension 30 mL   paliperidone (INVEGA) 24 hr tablet 3 mg   divalproex (DEPAKOTE ER) 24 hr tablet 500 mg   traZODone (DESYREL) tablet 50 mg   PTA Medications  Medication Sig  paliperidone (INVEGA) 3 MG 24 hr tablet Take 3 mg by mouth at bedtime. (Patient not taking: Reported on 12/26/2022)   pantoprazole (PROTONIX) 40 MG tablet Take 40 mg by mouth daily. (Patient not taking: Reported on 12/26/2022)   divalproex (DEPAKOTE) 500 MG DR tablet Take 500 mg by mouth daily. (Patient not taking: Reported on 12/26/2022)    Flowsheet Row ED from 12/26/2022 in [facility] ED from 03/06/2022 in [facility] Emergency Department at [facility]  C-SSRS RISK CATEGORY No Risk No Risk       Musculoskeletal   Strength & Muscle Tone: within normal limits Gait & Station: normal Patient leans: N/A  Psychiatric Specialty Exam  Presentation  General Appearance:  Appropriate for Environment  Eye Contact: Fair  Speech: Clear and Coherent  Speech Volume: Decreased  Handedness: Right   Mood and Affect  Mood: Dysphoric  Affect: Flat   Thought Process  Thought Processes: Linear  Descriptions of Associations:Intact  Orientation:Full (Time, Place and Person)  Thought Content:Logical  Diagnosis of Schizophrenia or Schizoaffective disorder in past: Yes  Duration of Psychotic Symptoms: Greater than six months   Hallucinations:Hallucinations: None  Ideas of Reference:None  Suicidal Thoughts:Suicidal Thoughts: No  Homicidal Thoughts:Homicidal Thoughts: No   Sensorium  Memory: Immediate Fair  Judgment: Poor  Insight: Poor   Executive Functions  Concentration: Poor  Attention Span: Poor  Recall: Poor  Fund of Knowledge: Poor  Language: Poor   Psychomotor Activity  Psychomotor Activity: Psychomotor Activity: Normal   Assets  Assets: Social Support; Physical Health; Leisure Time; Manufacturing systems engineer; Desire for Improvement   Sleep  Sleep: Sleep: Fair   Physical Exam  Physical Exam HENT:     Head: Normocephalic.     Nose: Nose normal.  Eyes:     Conjunctiva/sclera: Conjunctivae normal.  Cardiovascular:     Rate and Rhythm: Normal rate.  Pulmonary:     Effort: Pulmonary effort is normal.  Musculoskeletal:        General: Normal range of motion.     Cervical back: Normal range of motion.  Neurological:     Mental Status: He is alert and oriented to person, place, and time.   Review of Systems  Constitutional: Negative.   HENT: Negative.    Eyes: Negative.   Respiratory: Negative.    Cardiovascular: Negative.   Gastrointestinal: Negative.   Genitourinary: Negative.   Musculoskeletal: Negative.   Neurological: Negative.     Blood pressure 110/71, pulse 67, temperature 98 F (36.7 C), temperature source Oral, resp. rate 18, SpO2 100%. There is no height or weight on file to calculate BMI.   Disposition: Patient to transfer to [facility] today, 12/28/22. Attending Physician Dr. Phyllis Ginger. [place], MD. Patient is under IVC. EMTALA completed. Labs and medications reviewed.   Dionicia Cerritos L, NP 12/28/2022, 9:08 AM

## 2022-12-28 NOTE — ED Notes (Signed)
[facility] requested to [facility] after 8am this morning.  Pt is IVC.

## 2022-12-28 NOTE — Group Note (Deleted)
Group Topic: Balance in Life  Group Date: 12/28/2022 Start Time: 2000 End Time: 2030 Facilitators: Guss Bunde  Department: [employer]  Number of Participants: 2  Group Focus: acceptance Treatment Modality:  Cognitive Behavioral Therapy Interventions utilized were confrontation Purpose: enhance coping skills  Name: DEVARIS QUIRK Date of Birth: [birth year]  MR: [id number]    Level of Participation: active Quality of Participation: impulsive Interactions with others: gave feedback Mood/Affect: manic Triggers (if applicable):  Cognition: confused Progress: Moderate Response:  Plan:   Patients Problems:  Patient Active Problem List   Diagnosis Date Noted   Psychosis (HCC) 12/26/2022   Obesity 06/07/2011   Headache 06/07/2011   Migraine 06/07/2011

## 2022-12-28 NOTE — ED Notes (Signed)
Pt escorted to [facility] with staff.  All belongings from locker #3 were given to [employer].  IVC and EMTALA papers along with labs, EKG, MAR and summery were sent with transport.  No distress noted.

## 2022-12-28 NOTE — ED Notes (Signed)
Pt is currently resting quietly.  Breathing even and unlabored. Pt is in view of nursing station.  Staff will cont to monitor for safety.

## 2022-12-28 NOTE — ED Notes (Signed)
[facility] called at [phone] and verified pt does have a bed.  Admissions asked that report be called within an hour of pt arriving.  Currently awaiting [employer] for transfer.

## 2022-12-28 NOTE — Discharge Instructions (Addendum)
Transfer to [facility]   Physician will be Dr. Phyllis Ginger. Jaclynn Major, MD

## 2022-12-28 NOTE — ED Notes (Signed)
Report called to Research officer, trade union at [facility].  Verbalized understanding.  Pt awake and alert and up to nursing station asking for juice.  This was provided.  Pt declined any breakfast.   Took medications this am without incident.  [employer] called and are there way.
